# Patient Record
Sex: Male | Born: 1980 | Race: Black or African American | Hispanic: No | Marital: Single | Smoking: Current every day smoker
Health system: Southern US, Community
[De-identification: ages and names within clinical notes are randomized; demographics above are authoritative.]

## PROBLEM LIST (undated history)

## (undated) DIAGNOSIS — I38 Endocarditis, valve unspecified: Secondary | ICD-10-CM

## (undated) DIAGNOSIS — I10 Essential (primary) hypertension: Secondary | ICD-10-CM

## (undated) DIAGNOSIS — I2510 Atherosclerotic heart disease of native coronary artery without angina pectoris: Secondary | ICD-10-CM

## (undated) HISTORY — PX: CARDIAC CATHETERIZATION: SHX172

---

## 2011-08-28 ENCOUNTER — Emergency Department (HOSPITAL_BASED_OUTPATIENT_CLINIC_OR_DEPARTMENT_OTHER)
Admission: EM | Admit: 2011-08-28 | Discharge: 2011-08-28 | Disposition: A | Discharge status: Home / Self Care | Payer: Self-pay | Attending: Emergency Medicine | Admitting: Emergency Medicine

## 2011-08-28 ENCOUNTER — Encounter (HOSPITAL_BASED_OUTPATIENT_CLINIC_OR_DEPARTMENT_OTHER): Payer: Self-pay

## 2011-08-28 DIAGNOSIS — H109 Unspecified conjunctivitis: Secondary | ICD-10-CM | POA: Insufficient documentation

## 2011-08-28 DIAGNOSIS — F17200 Nicotine dependence, unspecified, uncomplicated: Secondary | ICD-10-CM | POA: Insufficient documentation

## 2011-08-28 DIAGNOSIS — I10 Essential (primary) hypertension: Secondary | ICD-10-CM | POA: Insufficient documentation

## 2011-08-28 HISTORY — DX: Essential (primary) hypertension: I10

## 2011-08-28 MED ORDER — CIPROFLOXACIN HCL 0.3 % OP SOLN
2.0000 [drp] | Freq: Once | OPHTHALMIC | Status: AC
  Administered 2011-08-28: 2 [drp] via OPHTHALMIC
  Filled 2011-08-28: qty 2.5

## 2011-08-28 MED ORDER — TETRACAINE HCL 0.5 % OP SOLN
2.0000 [drp] | Freq: Once | OPHTHALMIC | Status: DC
  Filled 2011-08-28: qty 2

## 2011-08-28 MED ORDER — FLUORESCEIN SODIUM 1 MG OP STRP
1.0000 | ORAL_STRIP | Freq: Once | OPHTHALMIC | Status: DC
  Filled 2011-08-28: qty 1

## 2011-08-28 NOTE — ED Provider Notes (Signed)
Medical screening examination/treatment/procedure(s) were performed by non-physician practitioner and as supervising physician I was immediately available for consultation/collaboration.   Forbes Loll, MD 08/28/11 1947 

## 2011-08-28 NOTE — ED Provider Notes (Signed)
History     CSN: [id number]  Arrival date & time 08/28/11  1635   First MD Initiated Contact with Patient 08/28/11 1648      Chief Complaint  Patient presents with  . Eye Problem    (Consider location/radiation/quality/duration/timing/severity/associated sxs/prior treatment) HPI Comments: Patient presents with a two-day history of eye redness. Patient states that symptoms started gradually yesterday. It causes eyes to itch. Patient states that the redness is worse in the left side however he does have some symptoms on the right. No severe blurry vision. Patient does not have pain with movement of the eyes. Upon waking this morning the patient noted to have crusting on his eyelashes. No known sick contacts. No treatments prior to arrival. Onset was gradual. Course is constant. Nothing makes symptoms better or worse.  Patient is a 31 y.o. male presenting with conjunctivitis. The history is provided by the patient.  Conjunctivitis  The current episode started yesterday. The onset was gradual. The problem has been unchanged. The problem is mild. Nothing relieves the symptoms. Nothing aggravates the symptoms. Associated symptoms include eye itching, headaches, eye discharge and eye redness. Pertinent negatives include no fever, no decreased vision, no double vision, no photophobia, no nausea, no vomiting, no congestion, no ear discharge, no ear pain, no rhinorrhea, no sore throat, no rash and no eye pain. There is pain in both eyes. The eye pain is not associated with movement. The eyelid exhibits no abnormality.    Past Medical History  Diagnosis Date  . Hypertension     History reviewed. No pertinent past surgical history.  No family history on file.  History  Substance Use Topics  . Smoking status: Current Everyday Smoker -- 0.5 packs/day  . Smokeless tobacco: Never Used  . Alcohol Use: Yes     occasional      Review of Systems  Constitutional: Positive for fatigue. Negative  for fever.  HENT: Negative for ear pain, congestion, sore throat, rhinorrhea, neck stiffness, sinus pressure and ear discharge.   Eyes: Positive for discharge, redness and itching. Negative for double vision, photophobia and pain.  Gastrointestinal: Negative for nausea and vomiting.  Skin: Negative for rash.  Neurological: Positive for headaches.  Hematological: Negative for adenopathy.    Allergies  Review of patient's allergies indicates no known allergies.  Home Medications   Current Outpatient Rx  Name Route Sig Dispense Refill  . DIPHENHYDRAMINE HCL 25 MG PO TABS Oral Take 50 mg by mouth once as needed. For allergies    . LISINOPRIL-HYDROCHLOROTHIAZIDE 20-25 MG PO TABS Oral Take 1 tablet by mouth daily.    Marland Kitchen NAPROXEN SODIUM 220 MG PO TABS Oral Take 440 mg by mouth 2 (two) times daily as needed. For pain      BP 150/93  Pulse 70  Temp 98.4 F (36.9 C) (Oral)  Resp 20  Ht 5\' 11"  (1.803 m)  Wt 180 lb (81.647 kg)  BMI 25.10 kg/m2  SpO2 100%  Physical Exam  Nursing note and vitals reviewed. Constitutional: He appears well-developed and well-nourished.  HENT:  Head: Normocephalic and atraumatic.  Right Ear: External ear normal.  Left Ear: External ear normal.  Nose: Nose normal.  Mouth/Throat: Oropharynx is clear and moist. No oropharyngeal exudate.  Eyes: EOM are normal. Pupils are equal, round, and reactive to light. Right eye exhibits no chemosis, no discharge and no exudate. No foreign body present in the right eye. Left eye exhibits no chemosis, no discharge and no exudate. No foreign body  present in the left eye. Right conjunctiva is injected. Right conjunctiva has no hemorrhage. Left conjunctiva is injected (Left greater than right). Left conjunctiva has no hemorrhage. Right eye exhibits normal extraocular motion. Left eye exhibits normal extraocular motion.  Neck: Normal range of motion. Neck supple.  Pulmonary/Chest: No respiratory distress.  Neurological: He is  alert.  Skin: Skin is warm and dry.  Psychiatric: He has a normal mood and affect.    ED Course  Procedures (including critical care time)  Labs Reviewed - No data to display No results found.   1. Conjunctivitis of both eyes     5:29 PM Patient seen and examined. Medications ordered.   Vital signs reviewed and are as follows: Filed Vitals:   08/28/11 1640  BP: 150/93  Pulse: 70  Temp: 98.4 F (36.9 C)  Resp: 20   Patient counseled on good hygiene. Patient urged to follow up with ophthalmologist if not improved in 3 days. Patient counseled on use of ciprofloxacin eyedrops. Patient urged to return with fever, loss of vision, worsening vision, redness or swelling around his eye, if he has trouble moving his eye, or has any other concerns. Patient verbalizes understanding and agrees with the plan.   MDM  Bilateral conjunctivitis.  No foreign bodies suspected. No surrounding erythema, swelling, vision changes/loss suspicious for orbital or periorbital cellulitis. No signs of iritis. No signs of glaucoma. No symptoms of retinal detachment. No ophthalmologic emergency suspected. Outpatient referral given in case of no improvement.          Renne Crigler, [place] 08/28/11 1734

## 2011-08-28 NOTE — ED Notes (Signed)
Left eye redness and blurry since yesterday.

## 2011-08-31 ENCOUNTER — Emergency Department (HOSPITAL_BASED_OUTPATIENT_CLINIC_OR_DEPARTMENT_OTHER)
Admission: EM | Admit: 2011-08-31 | Discharge: 2011-08-31 | Disposition: A | Discharge status: Home / Self Care | Payer: Self-pay | Attending: Emergency Medicine | Admitting: Emergency Medicine

## 2011-08-31 ENCOUNTER — Encounter (HOSPITAL_BASED_OUTPATIENT_CLINIC_OR_DEPARTMENT_OTHER): Payer: Self-pay | Admitting: *Deleted

## 2011-08-31 DIAGNOSIS — H109 Unspecified conjunctivitis: Secondary | ICD-10-CM | POA: Insufficient documentation

## 2011-08-31 DIAGNOSIS — I10 Essential (primary) hypertension: Secondary | ICD-10-CM | POA: Insufficient documentation

## 2011-08-31 DIAGNOSIS — R300 Dysuria: Secondary | ICD-10-CM | POA: Insufficient documentation

## 2011-08-31 DIAGNOSIS — H538 Other visual disturbances: Secondary | ICD-10-CM

## 2011-08-31 DIAGNOSIS — F17200 Nicotine dependence, unspecified, uncomplicated: Secondary | ICD-10-CM | POA: Insufficient documentation

## 2011-08-31 LAB — URINALYSIS, ROUTINE W REFLEX MICROSCOPIC
Glucose, UA: NEGATIVE mg/dL
Ketones, ur: NEGATIVE mg/dL
Leukocytes, UA: NEGATIVE
Nitrite: NEGATIVE
Protein, ur: NEGATIVE mg/dL

## 2011-08-31 NOTE — ED Provider Notes (Addendum)
History    This chart was scribed for Jesus B. Bernette Mayers, MD, MD by Jesus Silva. The patient was seen in room MH11 and the patient's care was started at 6:08PM.   CSN: [id number]  Arrival date & time 08/31/11  1731   First MD Initiated Contact with Patient 08/31/11 1806      Chief Complaint  Patient presents with  . Conjunctivitis     . Dysuria    (Consider location/radiation/quality/duration/timing/severity/associated sxs/prior treatment) Patient is a 31 y.o. male presenting with conjunctivitis, STD exposure, and dysuria. The history is provided by the patient.  Conjunctivitis   Exposure to STD  Dysuria    Jesus Silva is a 31 y.o. male who presents to the Emergency Department complaining of moderate, conjunctivitis onset 4 days ago and dysuria onset 1 day ago. Pt reports that he was seen in ED 3 days ago for conjunctivitis and given eye drops. He reports that eye irritation symptoms have not improved since using the eye drops and still has complaints of blurry vision which is getting worse. He states that he had a brief self-limited nose bleed just PTA. As far as the dysuria, that started yesterday, burning with urination, but no fever, nausea or flank pain. Pt denies penile discharge, testicular pain and hematuria. Pt reports that he has not had unprotected sex within last 6 months. He states that he had his last STD test (negative) 1 year ago. Symptoms have been constant without radiation.   Past Medical History  Diagnosis Date  . Hypertension     History reviewed. No pertinent past surgical history.  History reviewed. No pertinent family history.  History  Substance Use Topics  . Smoking status: Current Everyday Smoker -- 0.5 packs/day  . Smokeless tobacco: Never Used  . Alcohol Use: Yes     occasional      Review of Systems  Genitourinary: Positive for dysuria.  All other systems reviewed and are negative.  10 Systems reviewed and all are negative for acute  change except as noted in the HPI.    Allergies  Review of patient's allergies indicates no known allergies.  Home Medications   Current Outpatient Rx  Name Route Sig Dispense Refill  . LISINOPRIL-HYDROCHLOROTHIAZIDE 20-25 MG PO TABS Oral Take 1 tablet by mouth daily.    Marland Kitchen DIPHENHYDRAMINE HCL 25 MG PO TABS Oral Take 50 mg by mouth once as needed. For allergies    . NAPROXEN SODIUM 220 MG PO TABS Oral Take 440 mg by mouth 2 (two) times daily as needed. For pain      BP 155/91  Pulse 80  Temp 98.6 F (37 C) (Oral)  Resp 20  Ht 5\' 11"  (1.803 m)  Wt 180 lb (81.647 kg)  BMI 25.10 kg/m2  SpO2 100%  Physical Exam  Nursing note and vitals reviewed. Constitutional: He is oriented to person, place, and time. He appears well-developed and well-nourished.  HENT:  Head: Normocephalic and atraumatic.  Eyes: EOM are normal. Pupils are equal, round, and reactive to light. Right conjunctiva is injected (mild). Left conjunctiva is injected (mild).       Anterior chambers are nl   Neck: Normal range of motion. Neck supple.  Pulmonary/Chest: Effort normal. No respiratory distress.  Neurological: He is alert and oriented to person, place, and time. No cranial nerve deficit.  Skin: Skin is warm and dry.  Psychiatric: He has a normal mood and affect. His behavior is normal.    ED Course  Procedures (including  critical care time) DIAGNOSTIC STUDIES: Oxygen Saturation is 100% on room air , normal by my interpretation.    COORDINATION OF CARE:     Labs Reviewed  URINALYSIS, ROUTINE W REFLEX MICROSCOPIC   No results found.   No diagnosis found.    MDM  Visual acuity worsening since prior ED visit. He was advised to followup with Ophtho (Dr. Vonna Kotyk) at that visit, but did not call that office yet. Dysuria is of unclear etiology, UA is normal. Doubt STD given lack of symptoms and no recent unprotected sex. Will send urine for GC/C but won't treat empirically. Advised to finish taking  the eye drops but to call Dr. Vonna Kotyk' office for a more comprehensive eye exam.      I personally performed the services described in the documentation, which were scribed in my presence. The recorded information has been reviewed and considered.     Jesus B. Bernette Mayers, MD 08/31/11 1829  Jesus Levan. Bernette Mayers, MD 08/31/11 [phone]

## 2011-08-31 NOTE — ED Notes (Signed)
Pt amb to triage with quick steady gait smiling in nad. Pt reports seen here and dx with pink eye, "it's not getting any better with the drops". Also states he is having dysuria.

## 2011-09-01 LAB — GC/CHLAMYDIA PROBE AMP, URINE: GC Probe Amp, Urine: NEGATIVE

## 2012-06-21 ENCOUNTER — Encounter (HOSPITAL_COMMUNITY): Payer: Self-pay | Admitting: Nurse Practitioner

## 2012-06-21 ENCOUNTER — Emergency Department (HOSPITAL_COMMUNITY): Payer: BC Managed Care – PPO

## 2012-06-21 ENCOUNTER — Emergency Department (HOSPITAL_COMMUNITY)
Admission: EM | Admit: 2012-06-21 | Discharge: 2012-06-21 | Disposition: A | Discharge status: Home / Self Care | Payer: BC Managed Care – PPO | Attending: Emergency Medicine | Admitting: Emergency Medicine

## 2012-06-21 DIAGNOSIS — Z79899 Other long term (current) drug therapy: Secondary | ICD-10-CM | POA: Insufficient documentation

## 2012-06-21 DIAGNOSIS — F17200 Nicotine dependence, unspecified, uncomplicated: Secondary | ICD-10-CM | POA: Insufficient documentation

## 2012-06-21 DIAGNOSIS — I10 Essential (primary) hypertension: Secondary | ICD-10-CM | POA: Insufficient documentation

## 2012-06-21 DIAGNOSIS — R079 Chest pain, unspecified: Secondary | ICD-10-CM | POA: Insufficient documentation

## 2012-06-21 DIAGNOSIS — R0781 Pleurodynia: Secondary | ICD-10-CM

## 2012-06-21 MED ORDER — HYDROCODONE-ACETAMINOPHEN 5-325 MG PO TABS: ORAL_TABLET | ORAL | Status: DC

## 2012-06-21 NOTE — ED Provider Notes (Signed)
History     CSN: [id number]  Arrival date & time 06/21/12  1043   First MD Initiated Contact with Patient 06/21/12 1059      Chief Complaint  Patient presents with  . Abdominal Pain    (Consider location/radiation/quality/duration/timing/severity/associated sxs/prior treatment) Patient is a 32 y.o. male presenting with abdominal pain.  Abdominal Pain  Jesus Silva is a 32 y.o. male complaining of left-sided mid axillary lower rib pain onset last night significantly worsened this morning. Patient took [facility] powder and was not helped by this. He denies any trauma, recent lifting, recent strenuous activities. He denies shortness of breath.  Past Medical History  Diagnosis Date  . Hypertension     History reviewed. No pertinent past surgical history.  History reviewed. No pertinent family history.  History  Substance Use Topics  . Smoking status: Current Every Day Smoker -- 0.50 packs/day    Types: Cigarettes  . Smokeless tobacco: Never Used  . Alcohol Use: Yes     Comment: occasional      Review of Systems  Constitutional:       Negative except as described in HPI  HENT:       Negative except as described in HPI  Respiratory:       Negative except as described in HPI  Cardiovascular:       Negative except as described in HPI  Gastrointestinal:       Negative except as described in HPI  Genitourinary:       Negative except as described in HPI  Musculoskeletal:       Negative except as described in HPI  Skin:       Negative except as described in HPI  Neurological:       Negative except as described in HPI  All other systems reviewed and are negative.    Allergies  Review of patient's allergies indicates no known allergies.  Home Medications   Current Outpatient Rx  Name  Route  Sig  Dispense  Refill  . HYDROcodone-acetaminophen (NORCO/VICODIN) 5-325 MG per tablet      Take 1-2 tablets by mouth every 6 hours as needed for pain.   15 tablet   0      BP 163/97  Pulse 80  Temp(Src) 98.8 F (37.1 C) (Oral)  Resp 16  Ht 5\' 11"  (1.803 m)  Wt 180 lb (81.647 kg)  BMI 25.12 kg/m2  SpO2 100%  Physical Exam  Nursing note and vitals reviewed. Constitutional: He is oriented to person, place, and time. He appears well-developed and well-nourished. No distress.  HENT:  Head: Normocephalic and atraumatic.  Mouth/Throat: Oropharynx is clear and moist.  Eyes: Conjunctivae and EOM are normal. Pupils are equal, round, and reactive to light.  Neck: Normal range of motion.  Cardiovascular: Normal rate, regular rhythm and intact distal pulses.   Pulmonary/Chest: Effort normal and breath sounds normal. No stridor. No respiratory distress. He has no wheezes. He has no rales. He exhibits tenderness.    Abdominal: Soft. He exhibits no distension and no mass. There is no tenderness. There is no rebound and no guarding.  Musculoskeletal: Normal range of motion.  Neurological: He is alert and oriented to person, place, and time.  Psychiatric: He has a normal mood and affect.    ED Course  Procedures (including critical care time)  Labs Reviewed - No data to display Dg Ribs Unilateral W/chest Left  06/21/2012   *RADIOLOGY REPORT*  Clinical Data: Pain  LEFT RIBS  AND CHEST - 3+ VIEW  Comparison: Chest radiograph September 10, 2010  Findings:  Frontal chest and bilateral oblique views were obtained. Lungs clear.  Heart size and pulmonary vascularity are normal.  No pneumothorax or effusion.  No rib fracture apparent.  IMPRESSION: No abnormality noted.   Original Report Authenticated By: Bretta Bang, M.D.     1. Rib pain on left side       MDM   Filed Vitals:   06/21/12 1054  BP: 163/97  Pulse: 80  Temp: 98.8 F (37.1 C)  TempSrc: Oral  Resp: 16  Height: 5\' 11"  (1.803 m)  Weight: 180 lb (81.647 kg)  SpO2: 100%     Jesus Silva is a 32 y.o. male point tender in the left midaxillary lower ribs. Plain film showed no  abnormalities.   Pt is hemodynamically stable, appropriate for, and amenable to discharge at this time. Pt verbalized understanding and agrees with care plan. Outpatient follow-up and specific return precautions discussed.    Discharge Medication List as of 06/21/2012 12:25 PM    START taking these medications   Details  HYDROcodone-acetaminophen (NORCO/VICODIN) 5-325 MG per tablet Take 1-2 tablets by mouth every 6 hours as needed for pain., [employer], PA-C 06/21/12 1552

## 2012-06-21 NOTE — ED Notes (Signed)
C/o L sided rib pain since last night, increasing in severity since onset, unrelieved by [facility] powder. Denies any injuries or other complaints

## 2012-06-21 NOTE — ED Notes (Signed)
Pt returned from xray

## 2012-06-21 NOTE — ED Notes (Signed)
Patient transported to X-ray 

## 2012-06-22 NOTE — ED Provider Notes (Signed)
Medical screening examination/treatment/procedure(s) were performed by non-physician practitioner and as supervising physician I was immediately available for consultation/collaboration.   Blane Worthington M Paelyn Smick, DO 06/22/12 0952 

## 2012-07-20 ENCOUNTER — Ambulatory Visit: Payer: BC Managed Care – PPO | Admitting: Internal Medicine

## 2012-07-20 DIAGNOSIS — Z0289 Encounter for other administrative examinations: Secondary | ICD-10-CM

## 2012-07-25 ENCOUNTER — Encounter (HOSPITAL_COMMUNITY): Payer: Self-pay | Admitting: *Deleted

## 2012-07-25 ENCOUNTER — Emergency Department (HOSPITAL_COMMUNITY)
Admission: EM | Admit: 2012-07-25 | Discharge: 2012-07-25 | Disposition: A | Discharge status: Home / Self Care | Payer: BC Managed Care – PPO | Attending: Emergency Medicine | Admitting: Emergency Medicine

## 2012-07-25 DIAGNOSIS — Z8679 Personal history of other diseases of the circulatory system: Secondary | ICD-10-CM | POA: Insufficient documentation

## 2012-07-25 DIAGNOSIS — I2510 Atherosclerotic heart disease of native coronary artery without angina pectoris: Secondary | ICD-10-CM | POA: Insufficient documentation

## 2012-07-25 DIAGNOSIS — R42 Dizziness and giddiness: Secondary | ICD-10-CM | POA: Insufficient documentation

## 2012-07-25 DIAGNOSIS — R51 Headache: Secondary | ICD-10-CM | POA: Insufficient documentation

## 2012-07-25 DIAGNOSIS — F17200 Nicotine dependence, unspecified, uncomplicated: Secondary | ICD-10-CM | POA: Insufficient documentation

## 2012-07-25 DIAGNOSIS — Z760 Encounter for issue of repeat prescription: Secondary | ICD-10-CM

## 2012-07-25 DIAGNOSIS — Z79899 Other long term (current) drug therapy: Secondary | ICD-10-CM | POA: Insufficient documentation

## 2012-07-25 DIAGNOSIS — I10 Essential (primary) hypertension: Secondary | ICD-10-CM | POA: Insufficient documentation

## 2012-07-25 HISTORY — DX: Endocarditis, valve unspecified: I38

## 2012-07-25 HISTORY — DX: Atherosclerotic heart disease of native coronary artery without angina pectoris: I25.10

## 2012-07-25 MED ORDER — ACETAMINOPHEN 325 MG PO TABS
650.0000 mg | ORAL_TABLET | Freq: Once | ORAL | Status: AC
  Administered 2012-07-25: 650 mg via ORAL
  Filled 2012-07-25: qty 2

## 2012-07-25 MED ORDER — LISINOPRIL 10 MG PO TABS: 10.0000 mg | ORAL_TABLET | Freq: Every day | ORAL | Status: DC

## 2012-07-25 NOTE — ED Notes (Addendum)
States bp was higher earlier today when checked at [facility]- 153/129. C/o headache. States should be taking lisinopril but ran out

## 2012-07-25 NOTE — ED Provider Notes (Signed)
History    CSN: [id number] Arrival date & time 07/25/12  2102  First MD Initiated Contact with Patient 07/25/12 2214     Chief Complaint  Patient presents with  . Headache   (Consider location/radiation/quality/duration/timing/severity/associated sxs/prior Treatment) HPI  32 year old male with history of hypertension and history of CAD presents for evaluation of headache. 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  Headache is non radiating, 4/10, has similar headache like this before.  No specific treatment tried.  He did report mild lightheadedness and decided to come to [facility] to check his BP and found it to be elevated in 150s systolic.  Sts he usually takes lisinopril however has been without his medication for over a month due to having no refills and no PCP.  He otherwise denies vision changes, sneeze, cough, difficulty thinking, cp, sob, dizziness, ear pain, n/v/d, numbness or weakness.  Denies light/sound sensitivity.  Headache has improved without treatment and lightheadedness has resolved.  Has been eating/drinking as usual.  No hx of exertional syncope.    Past Medical History  Diagnosis Date  . Hypertension   . Coronary artery disease   . Heart valve disorder    No past surgical history on file. No family history on file. History  Substance Use Topics  . Smoking status: Current Every Day Smoker -- 0.50 packs/day    Types: Cigarettes  . Smokeless tobacco: Never Used  . Alcohol Use: 3.3 oz/week    3 Shots of liquor, 3 Drinks containing 0.5 oz of alcohol per week    Review of Systems  All other systems reviewed and are negative.    Allergies  Review of patient's allergies indicates no known allergies.  Home Medications  No current outpatient prescriptions on file. BP 117/68  Pulse 87  Temp(Src) 98.7 F (37.1 C) (Oral)  Resp 20  SpO2 100% Physical Exam  Nursing note and vitals  reviewed. Constitutional: He appears well-developed and well-nourished. No distress.  Awake, alert, nontoxic appearance  HENT:  Head: Atraumatic.  Right Ear: External ear normal.  Mouth/Throat: Oropharynx is clear and moist.  Eyes: Conjunctivae and EOM are normal. Pupils are equal, round, and reactive to light. Right eye exhibits no discharge. Left eye exhibits no discharge.  Neck: Normal range of motion. Neck supple.  Cardiovascular: Normal rate, regular rhythm and intact distal pulses.  Exam reveals no gallop and no friction rub.   No murmur heard. Pulmonary/Chest: Effort normal. No respiratory distress. He exhibits no tenderness.  Abdominal: Soft. There is no tenderness. There is no rebound.  Musculoskeletal: He exhibits no tenderness.  ROM appears intact, no obvious focal weakness  Neurological: He is alert. He has normal strength. No cranial nerve deficit or sensory deficit. He displays a negative Romberg sign. Coordination and gait normal. GCS eye subscore is 4. GCS verbal subscore is 5. GCS motor subscore is 6.  Skin: Skin is warm and dry. No rash noted.  Psychiatric: He has a normal mood and affect.    ED Course  Procedures (including critical care time)  10:30 PM    Headache similar to previous, no fever, neck stiffness, neuro findings or new symptoms to suggest more serious etiology.  I don't think SAH, ICH, meningitis, encephalitis, mass at this time.  No recent trauma.  I don't feel imaging necessary at this time.  Plan to control symptoms.  Pt's BP is normal at this time, is afebrile,  VSS.  Plan to refill his lisinopril and also give resource for pt to f/u for further care.  Recommend having vision check and avoid prolonged staring at the computer as it can worsen his sxs.  No orthostatic hypotension.     Labs Reviewed - No data to display No results found. 1. Headache   2. Medication refill     MDM  BP 150/94  Pulse 66  Temp(Src) 98.7 F (37.1 C) (Oral)  Resp 20   SpO2 100%   Fayrene Helper, PA-C 07/25/12 2258

## 2012-07-26 NOTE — ED Provider Notes (Signed)
Medical screening examination/treatment/procedure(s) were performed by non-physician practitioner and as supervising physician I was immediately available for consultation/collaboration.   Ashby Dawes, MD 07/26/12 1009

## 2012-08-11 ENCOUNTER — Ambulatory Visit: Payer: BC Managed Care – PPO | Admitting: Family Medicine

## 2013-05-15 ENCOUNTER — Encounter (HOSPITAL_COMMUNITY): Payer: Self-pay | Admitting: Emergency Medicine

## 2013-05-15 ENCOUNTER — Emergency Department (HOSPITAL_COMMUNITY)
Admission: EM | Admit: 2013-05-15 | Discharge: 2013-05-15 | Disposition: A | Discharge status: Home / Self Care | Payer: BC Managed Care – PPO | Attending: Emergency Medicine | Admitting: Emergency Medicine

## 2013-05-15 DIAGNOSIS — Z202 Contact with and (suspected) exposure to infections with a predominantly sexual mode of transmission: Secondary | ICD-10-CM | POA: Insufficient documentation

## 2013-05-15 DIAGNOSIS — Z206 Contact with and (suspected) exposure to human immunodeficiency virus [HIV]: Secondary | ICD-10-CM

## 2013-05-15 DIAGNOSIS — Z79899 Other long term (current) drug therapy: Secondary | ICD-10-CM | POA: Insufficient documentation

## 2013-05-15 DIAGNOSIS — I2510 Atherosclerotic heart disease of native coronary artery without angina pectoris: Secondary | ICD-10-CM | POA: Insufficient documentation

## 2013-05-15 DIAGNOSIS — I10 Essential (primary) hypertension: Secondary | ICD-10-CM | POA: Insufficient documentation

## 2013-05-15 DIAGNOSIS — F17200 Nicotine dependence, unspecified, uncomplicated: Secondary | ICD-10-CM | POA: Insufficient documentation

## 2013-05-15 MED ORDER — RALTEGRAVIR POTASSIUM 400 MG PO TABS: 400.0000 mg | ORAL_TABLET | Freq: Two times a day (BID) | ORAL | Status: AC

## 2013-05-15 MED ORDER — RALTEGRAVIR POTASSIUM 400 MG PO TABS
400.0000 mg | ORAL_TABLET | Freq: Two times a day (BID) | ORAL | Status: DC
  Administered 2013-05-15: 400 mg via ORAL
  Filled 2013-05-15: qty 1

## 2013-05-15 MED ORDER — EMTRICITABINE-TENOFOVIR DF 200-300 MG PO TABS: 1.0000 | ORAL_TABLET | Freq: Every day | ORAL | Status: AC

## 2013-05-15 MED ORDER — EMTRICITABINE-TENOFOVIR DF 200-300 MG PO TABS
1.0000 | ORAL_TABLET | Freq: Every day | ORAL | Status: DC
  Administered 2013-05-15: 1 via ORAL
  Filled 2013-05-15: qty 1

## 2013-05-15 NOTE — Discharge Instructions (Signed)
AIDS AIDS (Acquired Immune Deficiency Syndrome) is a severe viral infection caused by the Human Immunodeficiency Virus (HIV). This virus destroys a person's resistance to disease and certain cancers. It is transmitted through blood, blood products, and body fluids. Although the fear of AIDS has grown faster than the epidemic, it is important to note that AIDS is not spread by casual contact and is easily killed by hot water, soap, bleach, and most antiseptics. HOW THE AIDS INFECTION WORKS Once HIV enters the body it affects T-helper (T-4) lymphocytes. These are white blood cells that are crucial for the immune system. Once they become infected, they become factories for producing the AIDS Virus. Eventually these infected T-4 cells die. This leaves the victim susceptible to infection and certain cancers. While anyone can get AIDS, you are unlikely to get this disease unless you indulge in high-risk behavior. Some of these high-risk behaviors are promiscuous sex, having close relationships with HIV-positive people, and the sharing of needles. This illness was initially more common in homosexual men, but as time progresses, it will most probably affect an equal number of men and women. Babies born to women who are infected, have greater chances of getting AIDS. Infection with HIV may cause a brief, mild illness with fever several weeks after contact. More serious symptoms do not develop until months or years later, so a person can be infected with the virus without showing any symptoms at all. At this stage of the infection there is no way of knowing you are infected unless you have a blood or mouth scraping test for the AIDS virus. SYMPTOMS   Fevers, night sweats, general weakness, enlarged lymph nodes.  Chest pain, pneumonia, chronic cough, shortness of breath.  Weight loss, diarrhea, difficulty swallowing, rectal problems.  Headaches, personality changes, problems with vision and memory.  Skin tumors  (patchy dark areas) and infections. There is no cure or vaccine for AIDS at the present time. Anti-viral antibiotic drugs have been shown to stop the virus from multiplying, which helps prolong health. The best treatment against AIDS is prevention. If you have been infected with HIV, careful medical follow-up with regular blood tests is necessary.  Do not take part in risky behaviors. These include sharing needles and syringes or other sharp instruments like razors with others, having unprotected sex with high-risk people (homosexuals, bisexuals, or prostitutes), and engaging in anal sex (with or without a condom).  For further information about AIDS, please call your caregiver, the health department, or the Center for Disease Control: 800-342-AIDS. MISCONCEPTIONS ABOUT AIDS  You can not get AIDS through casual contact. This includes sitting next to an AIDS infected person, being coughed on, living with, swimming with, eating food prepared by, sitting or lying next to someone with AIDS.  It is not caught from toilet seats, from showers, bath tubs, water fountains, phones, drinking glasses, or food touched or used by people with AIDS. Casual kissing will probably not transmit the disease. "[place] kissing" (putting one's tongue in another's mouth) is probably not a good idea as the AIDS Virus is present in saliva.  You will not get AIDS by donating blood. The needles used by blood banks are sterile and disposable.  There is no evidence that AIDS is transmitted through tears.  AIDS is not caught through mosquitoes.  Children with AIDS will not pass AIDS to other children in school without exchange of blood products or engaging in sex. In school, a child with AIDS is actually at greater risk because  of their weak immune status. Their susceptibility to the viruses and germs (bacteria) carried by children without AIDS is great. TESTING FOR AIDS  An ELISA (enzyme-linked immunoabsorbent assay) is a blood  test available to let you know if you have contracted AIDS. If this test is positive, it is usually repeated.  If positive a second time, a second test known as the Western blot test is performed. If the Western blot test is positive, it means you have been infected with HIV. PREVENTION  The best way to prevent AIDS is to avoid high-risk behavior.  The outlook for defeating AIDS is good. Millions of research dollars are being spent on creating a vaccine to prevent the disease as well as providing a cure. New drugs appear to be extremely effective at controlling the disease.  Your caregiver will educate you in all the most effective and current treatments. Document Released: 12/20/1999 Document Revised: 03/16/2011 Document Reviewed: 12/16/2007 [facility] Patient Information 2014 [place], [place].

## 2013-05-15 NOTE — ED Notes (Addendum)
Pt reports to the ED for eval of exposure to HIV. 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 Pt was exposed on Friday 05/12/13. Pt denies any symptoms at this time. He reports he wants to ask about being placed on an anti retro viral medication for post exposure prophylaxis. Pt A&Ox4, resp e/u, and skin warm and dry.

## 2013-05-15 NOTE — ED Provider Notes (Signed)
CSN: [id number]     Arrival date & time 05/15/13  1709 History   First MD Initiated Contact with Patient 05/15/13 1815     Chief Complaint  Patient presents with  . Exposure to STD     (Consider location/radiation/quality/duration/timing/severity/associated sxs/prior Treatment) HPI Comments: Patient reports having sex with his HIV+ partner on Firday when they suffered a condom break. He presents to the ED today requesting post-exposure prophylaxis.  Patient is a 33 y.o. male presenting with STD exposure. The history is provided by the patient. No language interpreter was used.  Exposure to STD This is a new problem. The current episode started in the past 7 days. The problem has been unchanged. Pertinent negatives include no urinary symptoms. Nothing aggravates the symptoms. He has tried nothing for the symptoms.    Past Medical History  Diagnosis Date  . Hypertension   . Coronary artery disease   . Heart valve disorder    History reviewed. No pertinent past surgical history. History reviewed. No pertinent family history. History  Substance Use Topics  . Smoking status: Current Every Day Smoker -- 0.50 packs/day    Types: Cigarettes  . Smokeless tobacco: Never Used  . Alcohol Use: 3.3 oz/week    3 Shots of liquor, 3 Drinks containing 0.5 oz of alcohol per week     Comment: occasionally    Review of Systems  All other systems reviewed and are negative.     Allergies  Review of patient's allergies indicates no known allergies.  Home Medications   Prior to Admission medications   Medication Sig Start Date End Date Taking? Authorizing Provider  acetaminophen (TYLENOL) 500 MG tablet Take 1,000 mg by mouth daily as needed for mild pain.   Yes Historical Provider, MD  amlodipine-benazepril (LOTREL) 2.5-10 MG per capsule Take by mouth daily. 03/08/13  Yes Historical Provider, MD  Aspirin-Salicylamide-Caffeine (BC HEADACHE POWDER PO) Take 1 packet by mouth daily as needed.    Yes Historical Provider, MD  diphenhydrAMINE (BENADRYL) 25 MG tablet Take 25 mg by mouth daily as needed for allergies.   Yes Historical Provider, MD  hydrOXYzine (ATARAX/VISTARIL) 50 MG tablet Take 50 mg by mouth daily as needed. 03/13/13  Yes Historical Provider, MD   BP 157/105  Pulse 72  Temp(Src) 98.4 F (36.9 C) (Oral)  Resp 18  SpO2 100% Physical Exam  Nursing note and vitals reviewed. Constitutional: He is oriented to person, place, and time. He appears well-developed and well-nourished. No distress.  HENT:  Head: Normocephalic.  Eyes: Conjunctivae are normal. Pupils are equal, round, and reactive to light.  Neck: Normal range of motion.  Cardiovascular: Normal rate and regular rhythm.   Pulmonary/Chest: Effort normal and breath sounds normal.  Abdominal: Soft.  Musculoskeletal: He exhibits no edema and no tenderness.  Lymphadenopathy:    He has no cervical adenopathy.  Neurological: He is alert and oriented to person, place, and time.  Skin: Skin is warm and dry.  Psychiatric: He has a normal mood and affect. His behavior is normal. Judgment and thought content normal.    ED Course  Procedures (including critical care time) Labs Review Labs Reviewed  HIV ANTIBODY (ROUTINE TESTING)  RPR    Imaging Review No results found.   EKG Interpretation None      MDM   Final diagnoses:  None    Exposure to HIV.  Post-exposure prophylaxis intitiated.    Jimmye Normanavid John Jovany Disano, NP 05/16/13 [phone]

## 2013-05-15 NOTE — ED Notes (Signed)
Discharge instructions reviewed with pt. Pt verbalized understanding.   

## 2013-05-15 NOTE — ED Notes (Signed)
Called pharmacy to order medicine.

## 2013-05-16 LAB — T.PALLIDUM AB, IGG

## 2013-05-16 LAB — HIV ANTIBODY (ROUTINE TESTING W REFLEX): HIV: NONREACTIVE

## 2013-05-16 LAB — RPR TITER

## 2013-05-16 LAB — RPR: RPR Ser Ql: REACTIVE — AB

## 2013-05-17 ENCOUNTER — Telehealth (HOSPITAL_BASED_OUTPATIENT_CLINIC_OR_DEPARTMENT_OTHER): Payer: Self-pay | Admitting: Emergency Medicine

## 2013-05-17 NOTE — Telephone Encounter (Signed)
+  RPR. Chart sent to EDP office for review. DHHS attached. 

## 2013-05-18 NOTE — ED Provider Notes (Signed)
  Medical screening examination/treatment/procedure(s) were performed by non-physician practitioner and as supervising physician I was immediately available for consultation/collaboration.   EKG Interpretation None         Taci Sterling, MD 05/18/13 1614 

## 2013-05-19 ENCOUNTER — Telehealth (HOSPITAL_BASED_OUTPATIENT_CLINIC_OR_DEPARTMENT_OTHER): Payer: Self-pay

## 2013-05-19 NOTE — Telephone Encounter (Signed)
Spoke with pt. He called for lab results. Verified ID by SS#.  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  He will follow up with his md.

## 2013-12-25 ENCOUNTER — Encounter (HOSPITAL_COMMUNITY): Payer: Self-pay | Admitting: *Deleted

## 2013-12-25 ENCOUNTER — Emergency Department (HOSPITAL_COMMUNITY): Payer: BC Managed Care – PPO

## 2013-12-25 ENCOUNTER — Emergency Department (HOSPITAL_COMMUNITY)
Admission: EM | Admit: 2013-12-25 | Discharge: 2013-12-25 | Disposition: A | Discharge status: Home / Self Care | Payer: BC Managed Care – PPO | Attending: Emergency Medicine | Admitting: Emergency Medicine

## 2013-12-25 DIAGNOSIS — R5383 Other fatigue: Secondary | ICD-10-CM | POA: Insufficient documentation

## 2013-12-25 DIAGNOSIS — Z79899 Other long term (current) drug therapy: Secondary | ICD-10-CM | POA: Insufficient documentation

## 2013-12-25 DIAGNOSIS — I10 Essential (primary) hypertension: Secondary | ICD-10-CM | POA: Insufficient documentation

## 2013-12-25 DIAGNOSIS — I2510 Atherosclerotic heart disease of native coronary artery without angina pectoris: Secondary | ICD-10-CM | POA: Insufficient documentation

## 2013-12-25 DIAGNOSIS — R5381 Other malaise: Secondary | ICD-10-CM | POA: Diagnosis not present

## 2013-12-25 DIAGNOSIS — Z720 Tobacco use: Secondary | ICD-10-CM | POA: Diagnosis not present

## 2013-12-25 DIAGNOSIS — R0789 Other chest pain: Secondary | ICD-10-CM | POA: Insufficient documentation

## 2013-12-25 DIAGNOSIS — Z9889 Other specified postprocedural states: Secondary | ICD-10-CM | POA: Insufficient documentation

## 2013-12-25 DIAGNOSIS — R079 Chest pain, unspecified: Secondary | ICD-10-CM | POA: Diagnosis present

## 2013-12-25 LAB — BASIC METABOLIC PANEL
Anion gap: 11 (ref 5–15)
BUN: 11 mg/dL (ref 6–23)
CALCIUM: 9.2 mg/dL (ref 8.4–10.5)
CHLORIDE: 102 meq/L (ref 96–112)
CO2: 28 mEq/L (ref 19–32)
Creatinine, Ser: 1.14 mg/dL (ref 0.50–1.35)
GFR calc Af Amer: >90 mL/min (ref >90)
GFR calc non Af Amer: 84 mL/min — ABNORMAL LOW (ref >90)
Glucose, Bld: 90 mg/dL (ref 70–99)
POTASSIUM: 3.9 meq/L (ref 3.7–5.3)
SODIUM: 141 meq/L (ref 137–147)

## 2013-12-25 LAB — CBC
HCT: 45.2 % (ref 39.0–52.0)
Hemoglobin: 15.7 g/dL (ref 13.0–17.0)
MCH: 30.8 pg (ref 26.0–34.0)
MCHC: 34.7 g/dL (ref 30.0–36.0)
MCV: 88.6 fL (ref 78.0–100.0)
Platelets: 210 10*3/uL (ref 150–400)
RBC: 5.1 MIL/uL (ref 4.22–5.81)
RDW: 12.4 % (ref 11.5–15.5)
WBC: 6.4 10*3/uL (ref 4.0–10.5)

## 2013-12-25 LAB — I-STAT TROPONIN, ED: Troponin i, poc: 0 ng/mL (ref 0.00–0.08)

## 2013-12-25 LAB — PRO B NATRIURETIC PEPTIDE: Pro B Natriuretic peptide (BNP): 12.7 pg/mL (ref 0–125)

## 2013-12-25 LAB — TSH: TSH: 1.67 u[IU]/mL (ref 0.350–4.500)

## 2013-12-25 NOTE — ED Provider Notes (Addendum)
CSN: [id number]     Arrival date & time 12/25/13  0106 History  This chart was scribed for Derwood KaplanAnkit Rilei Kravitz, MD by Annye AsaAnna Dorsett, ED Scribe. This patient was seen in room D31C/D31C and the patient's care was started at 1:45 AM.    Chief Complaint  Patient presents with  . Chest Pain   HPI   HPI Comments: Jesus Silva is a 33 y.o. male who presents to the Emergency Department complaining of 1 week of chest "tightness", fatigue and "racing heart" sensations. His discomfort waxes and wanes; it is worse when lying down and improves when sitting up. He denies waking in the night with SOB. He denies any recent weight gain, hair loss. He denies any psych issues or recent life stressors.   Per nurse's notes, patient has been out of his blood pressure medication and NTG for one week at this time.   Patient reports that he was scheduled for a cardiac cath procedure on 6/11 for concerns regarding heart failure; he had an anaphylactic reaction to iodine and the procedure was not performed.  Patient is a .5ppd smoker, occasional EtOH user. He denies substance abuse. Maternal heart transplant at 7130; paternal triple bypass in 940s; mom, dad, and aunt all passed from heart disease.   PCP is Verlon AuBoyd, Tammy Lamonica, MD.  Past Medical History  Diagnosis Date  . Hypertension   . Coronary artery disease   . Heart valve disorder    Past Surgical History  Procedure Laterality Date  . Cardiac catheterization     No family history on file. History  Substance Use Topics  . Smoking status: Current Every Day Smoker -- 0.50 packs/day    Types: Cigarettes  . Smokeless tobacco: Never Used  . Alcohol Use: 3.3 oz/week    3 Shots of liquor, 3 Not specified per week     Comment: occasionally -     Review of Systems  Constitutional: Positive for fatigue.  Respiratory: Positive for chest tightness.   Neurological: Positive for weakness.  All other systems reviewed and are negative.  Allergies  Iodine  Home  Medications   Prior to Admission medications   Medication Sig Start Date End Date Taking? Authorizing Provider  acetaminophen (TYLENOL) 500 MG tablet Take 500 mg by mouth daily as needed for mild pain.    Yes Historical Provider, MD  Aspirin-Salicylamide-Caffeine (BC HEADACHE POWDER PO) Take 1 packet by mouth daily as needed (pain).    Yes Historical Provider, MD  diphenhydrAMINE (BENADRYL) 25 MG tablet Take 25 mg by mouth daily as needed for allergies.   Yes Historical Provider, MD  hydrochlorothiazide (MICROZIDE) 12.5 MG capsule Take 12.5 mg by mouth daily.   Yes Historical Provider, MD  nitroGLYCERIN (NITROSTAT) 0.4 MG SL tablet Place 0.4 mg under the tongue every 5 (five) minutes as needed for chest pain.   Yes Historical Provider, MD  emtricitabine-tenofovir (TRUVADA) 200-300 MG per tablet Take 1 tablet by mouth daily. Patient not taking: Reported on 12/25/2013 05/15/13   Jimmye Normanavid John Smith, NP  hydrOXYzine (ATARAX/VISTARIL) 50 MG tablet Take 50 mg by mouth daily as needed for itching.  03/13/13   Historical Provider, MD  raltegravir (ISENTRESS) 400 MG tablet Take 1 tablet (400 mg total) by mouth 2 (two) times daily. Patient not taking: Reported on 12/25/2013 05/15/13   Jimmye Normanavid John Smith, NP   BP 145/105 mmHg  Pulse 66  Temp(Src) 98.7 F (37.1 C) (Oral)  Resp 17  Ht 5\' 11"  (1.803 m)  Wt 170  lb (77.111 kg)  BMI 23.72 kg/m2  SpO2 98% Physical Exam  Constitutional: He is oriented to person, place, and time. He appears well-developed and well-nourished.  HENT:  Head: Normocephalic and atraumatic.  Mouth/Throat: Oropharynx is clear and moist. No oropharyngeal exudate.  Eyes: EOM are normal. Pupils are equal, round, and reactive to light.  Cardiovascular: Normal rate, regular rhythm and normal heart sounds.  Exam reveals no gallop and no friction rub.   No murmur heard. Pulmonary/Chest: Effort normal and breath sounds normal. No respiratory distress. He has no wheezes. He has no rales.   Positive reproducable chest tightness/discomfort  Abdominal: Soft. There is no tenderness. There is no rebound and no guarding.  Musculoskeletal: Normal range of motion. He exhibits no edema.  BLE exam shows no edema; no unilateral swelling  Neurological: He is alert and oriented to person, place, and time.  Skin: Skin is warm and dry. No rash noted.  Psychiatric: He has a normal mood and affect. His behavior is normal.  Nursing note and vitals reviewed.   ED Course  Procedures   DIAGNOSTIC STUDIES: Oxygen Saturation is 100% on RA, normal by my interpretation.    COORDINATION OF CARE: 1:55 AM Discussed treatment plan with pt at bedside and pt agreed to plan.  3:29 AM Updated patient regarding lab results. Patient verbalized understanding and agreed.    Labs Review Labs Reviewed  BASIC METABOLIC PANEL - Abnormal; Notable for the following:    GFR calc non Af Amer 84 (*)    All other components within normal limits  CBC  PRO B NATRIURETIC PEPTIDE  TSH  I-STAT TROPOININ, ED    Imaging Review No results found.   EKG Interpretation   Date/Time:  Monday December 25 2013 01:13:35 EST Ventricular Rate:  78 PR Interval:  145 QRS Duration: 84 QT Interval:  365 QTC Calculation: 416 R Axis:   41 Text Interpretation:  Sinus rhythm ST elev, probable normal early repol  pattern No acute changes seen Confirmed by Rhunette CroftNANAVATI, MD, Janey GentaANKIT [phone]) on  12/25/2013 1:30:31 AM      MDM   Final diagnoses:  Malaise and fatigue    Medical screening examination/treatment/procedure(s) were performed by me as the supervising physician. Scribe service was utilized for documentation only.  PT comes in with malaise like symptoms, palpitations. Also out of his BP meds. Pt has family hx of heart dz, including need for transplant. [facility] cardi[facility]. Currently, pt is chest pain free. He has exertional dyspnea - however, BNP is normal, and CXR is  clear. Lung exam is also normal. Pt has no signs of DVT, and is PERC neg. We have advised pt to see his Cards for routine f.u and pcp as well. [facility], since he has moved here, and those resources will be provided as well.     Derwood KaplanAnkit Makayela Secrest, MD 01/15/14 1544  Derwood KaplanAnkit Meeyah Ovitt, MD 01/15/14 1606

## 2013-12-25 NOTE — Discharge Instructions (Signed)
We saw you in the ER for WEAKNESS. All the results in the ER are normal, labs and imaging. We are not sure what is causing your symptoms. The workup in the ER is not complete, and is limited to screening for life threatening and emergent conditions only, so please see a primary care doctor for further evaluation.  PLEASE ASK YOUR PRIMARY DOCTOR TO GIVE YOU CARDIOLOGY APPOINTMENT SO THAT YOU STAY IN THE SAME SYSTEM.   Fatigue Fatigue is a feeling of tiredness, lack of energy, lack of motivation, or feeling tired all the time. Having enough rest, good nutrition, and reducing stress will normally reduce fatigue. Consult your caregiver if it persists. The nature of your fatigue will help your caregiver to find out its cause. The treatment is based on the cause.  CAUSES  There are many causes for fatigue. Most of the time, fatigue can be traced to one or more of your habits or routines. Most causes fit into one or more of three general areas. They are: Lifestyle problems  Sleep disturbances.  Overwork.  Physical exertion.  Unhealthy habits.  Poor eating habits or eating disorders.  Alcohol and/or drug use .  Lack of proper nutrition (malnutrition). Psychological problems  Stress and/or anxiety problems.  Depression.  Grief.  Boredom. Medical Problems or Conditions  Anemia.  Pregnancy.  Thyroid gland problems.  Recovery from major surgery.  Continuous pain.  Emphysema or asthma that is not well controlled  Allergic conditions.  Diabetes.  Infections (such as mononucleosis).  Obesity.  Sleep disorders, such as sleep apnea.  Heart failure or other heart-related problems.  Cancer.  Kidney disease.  Liver disease.  Effects of certain medicines such as antihistamines, cough and cold remedies, prescription pain medicines, heart and blood pressure medicines, drugs used for treatment of cancer, and some antidepressants. SYMPTOMS  The symptoms of fatigue  include:   Lack of energy.  Lack of drive (motivation).  Drowsiness.  Feeling of indifference to the surroundings. DIAGNOSIS  The details of how you feel help guide your caregiver in finding out what is causing the fatigue. You will be asked about your present and past health condition. It is important to review all medicines that you take, including prescription and non-prescription items. A thorough exam will be done. You will be questioned about your feelings, habits, and normal lifestyle. Your caregiver may suggest blood tests, urine tests, or other tests to look for common medical causes of fatigue.  TREATMENT  Fatigue is treated by correcting the underlying cause. For example, if you have continuous pain or depression, treating these causes will improve how you feel. Similarly, adjusting the dose of certain medicines will help in reducing fatigue.  HOME CARE INSTRUCTIONS   Try to get the required amount of good sleep every night.  Eat a healthy and nutritious diet, and drink enough water throughout the day.  Practice ways of relaxing (including yoga or meditation).  Exercise regularly.  Make plans to change situations that cause stress. Act on those plans so that stresses decrease over time. Keep your work and personal routine reasonable.  Avoid street drugs and minimize use of alcohol.  Start taking a daily multivitamin after consulting your caregiver. SEEK MEDICAL CARE IF:   You have persistent tiredness, which cannot be accounted for.  You have fever.  You have unintentional weight loss.  You have headaches.  You have disturbed sleep throughout the night.  You are feeling sad.  You have constipation.  You have dry  skin.  You have gained weight.  You are taking any new or different medicines that you suspect are causing fatigue.  You are unable to sleep at night.  You develop any unusual swelling of your legs or other parts of your body. SEEK IMMEDIATE  MEDICAL CARE IF:   You are feeling confused.  Your vision is blurred.  You feel faint or pass out.  You develop severe headache.  You develop severe abdominal, pelvic, or back pain.  You develop chest pain, shortness of breath, or an irregular or fast heartbeat.  You are unable to pass a normal amount of urine.  You develop abnormal bleeding such as bleeding from the rectum or you vomit blood.  You have thoughts about harming yourself or committing suicide.  You are worried that you might harm someone else. MAKE SURE YOU:   Understand these instructions.  Will watch your condition.  Will get help right away if you are not doing well or get worse. Document Released: 10/19/2006 Document Revised: 03/16/2011 Document Reviewed: 04/25/2013 [facility] Patient Information 2015 [place] [place]. This information is not intended to replace advice given to you by your health care provider. Make sure you discuss any questions you have with your health care provider.  Weakness Weakness is a lack of strength. It may be felt all over the body (generalized) or in one specific part of the body (focal). Some causes of weakness can be serious. You may need further medical evaluation, especially if you are elderly or you have a history of immunosuppression (such as chemotherapy or HIV), kidney disease, heart disease, or diabetes. CAUSES  Weakness can be caused by many different things, including:  Infection.  Physical exhaustion.  Internal bleeding or other blood loss that results in a lack of red blood cells (anemia).  Dehydration. This cause is more common in elderly people.  Side effects or electrolyte abnormalities from medicines, such as pain medicines or sedatives.  Emotional distress, anxiety, or depression.  Circulation problems, especially severe peripheral arterial disease.  Heart disease, such as rapid atrial fibrillation, bradycardia, or heart failure.  Nervous system  disorders, such as Guillain-Barr syndrome, multiple sclerosis, or stroke. DIAGNOSIS  To find the cause of your weakness, your caregiver will take your history and perform a physical exam. Lab tests or X-rays may also be ordered, if needed. TREATMENT  Treatment of weakness depends on the cause of your symptoms and can vary greatly. HOME CARE INSTRUCTIONS   Rest as needed.  Eat a well-balanced diet.  Try to get some exercise every day.  Only take over-the-counter or prescription medicines as directed by your caregiver. SEEK MEDICAL CARE IF:   Your weakness seems to be getting worse or spreads to other parts of your body.  You develop new aches or pains. SEEK IMMEDIATE MEDICAL CARE IF:   You cannot perform your normal daily activities, such as getting dressed and feeding yourself.  You cannot walk up and down stairs, or you feel exhausted when you do so.  You have shortness of breath or chest pain.  You have difficulty moving parts of your body.  You have weakness in only one area of the body or on only one side of the body.  You have a fever.  You have trouble speaking or swallowing.  You cannot control your bladder or bowel movements.  You have black or bloody vomit or stools. MAKE SURE YOU:  Understand these instructions.  Will watch your condition.  Will get help right  away if you are not doing well or get worse. Document Released: 12/22/2004 Document Revised: 06/23/2011 Document Reviewed: 02/20/2011 [facility] Patient Information 2015 [place], [place]. This information is not intended to replace advice given to you by your health care provider. Make sure you discuss any questions you have with your health care provider.

## 2013-12-25 NOTE — ED Notes (Signed)
Pt arrives to ED c/o CP for over one week. Pt states that [facility] Dr Delora Fuelhahaun because of heart failure. States he has also been experiencing SOB and a racing heart off and on for over a week as well.

## 2013-12-25 NOTE — ED Notes (Signed)
MD at bedside. 

## 2013-12-25 NOTE — ED Notes (Signed)
Pt states that he is out of his BP medication and NTG. Pt states that he has an appointment with his PCP on Tuesday. States that he has missed a week of BP meds.

## 2014-01-03 ENCOUNTER — Telehealth: Payer: Self-pay | Admitting: *Deleted

## 2014-01-03 NOTE — Telephone Encounter (Signed)
called and ask pt to come in and sign a med release form so that we could request records from his previous cardiologist, he said he would come in Monday 01/08/14.Marland Kitchen..Marland Kitchen

## 2014-01-25 ENCOUNTER — Telehealth: Payer: Self-pay

## 2014-01-25 NOTE — Telephone Encounter (Signed)
SPOKE WITH  DR[place]..[phone] ...  PT WAS MADE AWARE THAT A MEDICAL RELEASE MUST BE SIGNED IN ORDER TO OBTAIN RECORD FOR HIS APPT WITH NISHAN .> DR CHAHAUN  OFFICE  WILL TRY TO CALL PATIENT TO SEE IF HE WILL BE ABLE TO COME TO THE OFFICE TO RETRIEVE HIS RECORDS..Marland Kitchen

## 2014-01-25 NOTE — Telephone Encounter (Signed)
CALLED PATIENT TO  FIND OUT IF HE  WAS ABLE TO GET RECORDS FOR APPT ON MON.JAN.25,2016 WITH NISHAN :. NO ANSWER.

## 2014-01-29 ENCOUNTER — Ambulatory Visit: Payer: BC Managed Care – PPO | Admitting: Cardiovascular Disease

## 2014-02-22 ENCOUNTER — Ambulatory Visit: Payer: Self-pay | Admitting: Cardiology

## 2014-04-03 ENCOUNTER — Ambulatory Visit: Payer: Self-pay | Admitting: Cardiology

## 2014-04-06 ENCOUNTER — Encounter: Payer: Self-pay | Admitting: Cardiology

## 2014-04-25 ENCOUNTER — Emergency Department (HOSPITAL_COMMUNITY)
Admission: EM | Admit: 2014-04-25 | Discharge: 2014-04-25 | Discharge status: Against Medical Advice | Payer: BLUE CROSS/BLUE SHIELD | Attending: Emergency Medicine | Admitting: Emergency Medicine

## 2014-04-25 ENCOUNTER — Encounter (HOSPITAL_COMMUNITY): Payer: Self-pay | Admitting: Emergency Medicine

## 2014-04-25 DIAGNOSIS — R197 Diarrhea, unspecified: Secondary | ICD-10-CM | POA: Insufficient documentation

## 2014-04-25 DIAGNOSIS — R1110 Vomiting, unspecified: Secondary | ICD-10-CM | POA: Diagnosis present

## 2014-04-25 LAB — COMPREHENSIVE METABOLIC PANEL
ALT: 18 U/L (ref 0–53)
ANION GAP: 8 (ref 5–15)
AST: 24 U/L (ref 0–37)
Albumin: 4.2 g/dL (ref 3.5–5.2)
Alkaline Phosphatase: 65 U/L (ref 39–117)
BUN: 6 mg/dL (ref 6–23)
CALCIUM: 8.9 mg/dL (ref 8.4–10.5)
CO2: 27 mmol/L (ref 19–32)
Chloride: 102 mmol/L (ref 96–112)
Creatinine, Ser: 1.04 mg/dL (ref 0.50–1.35)
GFR calc Af Amer: >90 mL/min (ref >90)
GFR calc non Af Amer: >90 mL/min (ref >90)
Glucose, Bld: 107 mg/dL — ABNORMAL HIGH (ref 70–99)
Potassium: 3.7 mmol/L (ref 3.5–5.1)
SODIUM: 137 mmol/L (ref 135–145)
TOTAL PROTEIN: 7.3 g/dL (ref 6.0–8.3)
Total Bilirubin: 0.7 mg/dL (ref 0.3–1.2)

## 2014-04-25 LAB — CBC WITH DIFFERENTIAL/PLATELET
BASOS ABS: 0 10*3/uL (ref 0.0–0.1)
Basophils Relative: 0 % (ref 0–1)
Eosinophils Absolute: 0 10*3/uL (ref 0.0–0.7)
Eosinophils Relative: 0 % (ref 0–5)
HCT: 45.3 % (ref 39.0–52.0)
Hemoglobin: 15.9 g/dL (ref 13.0–17.0)
LYMPHS PCT: 25 % (ref 12–46)
Lymphs Abs: 1.2 10*3/uL (ref 0.7–4.0)
MCH: 31.2 pg (ref 26.0–34.0)
MCHC: 35.1 g/dL (ref 30.0–36.0)
MCV: 88.8 fL (ref 78.0–100.0)
Monocytes Absolute: 0.5 10*3/uL (ref 0.1–1.0)
Monocytes Relative: 10 % (ref 3–12)
NEUTROS ABS: 3.1 10*3/uL (ref 1.7–7.7)
Neutrophils Relative %: 65 % (ref 43–77)
Platelets: 159 10*3/uL (ref 150–400)
RBC: 5.1 MIL/uL (ref 4.22–5.81)
RDW: 12.2 % (ref 11.5–15.5)
WBC: 4.8 10*3/uL (ref 4.0–10.5)

## 2014-04-25 LAB — LIPASE, BLOOD: Lipase: 24 U/L (ref 11–59)

## 2014-04-25 MED ORDER — ONDANSETRON 4 MG PO TBDP
ORAL_TABLET | ORAL | Status: AC
  Filled 2014-04-25: qty 2

## 2014-04-25 MED ORDER — ONDANSETRON 4 MG PO TBDP
8.0000 mg | ORAL_TABLET | Freq: Once | ORAL | Status: AC
  Administered 2014-04-25: 8 mg via ORAL

## 2014-04-25 NOTE — ED Notes (Signed)
Pt c/o nvd x 3 days, unable to keep foot or drink down. C/o abdominal cramping.

## 2014-11-02 ENCOUNTER — Encounter (HOSPITAL_COMMUNITY): Payer: Self-pay | Admitting: Emergency Medicine

## 2014-11-02 ENCOUNTER — Emergency Department (HOSPITAL_COMMUNITY)
Admission: EM | Admit: 2014-11-02 | Discharge: 2014-11-02 | Discharge status: Against Medical Advice | Payer: BLUE CROSS/BLUE SHIELD | Attending: Emergency Medicine | Admitting: Emergency Medicine

## 2014-11-02 ENCOUNTER — Emergency Department (HOSPITAL_COMMUNITY): Payer: BLUE CROSS/BLUE SHIELD

## 2014-11-02 DIAGNOSIS — Z720 Tobacco use: Secondary | ICD-10-CM | POA: Insufficient documentation

## 2014-11-02 DIAGNOSIS — R110 Nausea: Secondary | ICD-10-CM | POA: Diagnosis not present

## 2014-11-02 DIAGNOSIS — R079 Chest pain, unspecified: Secondary | ICD-10-CM | POA: Diagnosis present

## 2014-11-02 DIAGNOSIS — Z9889 Other specified postprocedural states: Secondary | ICD-10-CM | POA: Insufficient documentation

## 2014-11-02 DIAGNOSIS — F1210 Cannabis abuse, uncomplicated: Secondary | ICD-10-CM | POA: Insufficient documentation

## 2014-11-02 DIAGNOSIS — I2510 Atherosclerotic heart disease of native coronary artery without angina pectoris: Secondary | ICD-10-CM | POA: Insufficient documentation

## 2014-11-02 DIAGNOSIS — R509 Fever, unspecified: Secondary | ICD-10-CM | POA: Diagnosis not present

## 2014-11-02 DIAGNOSIS — Z79899 Other long term (current) drug therapy: Secondary | ICD-10-CM | POA: Insufficient documentation

## 2014-11-02 DIAGNOSIS — I10 Essential (primary) hypertension: Secondary | ICD-10-CM | POA: Insufficient documentation

## 2014-11-02 DIAGNOSIS — R319 Hematuria, unspecified: Secondary | ICD-10-CM | POA: Insufficient documentation

## 2014-11-02 DIAGNOSIS — R61 Generalized hyperhidrosis: Secondary | ICD-10-CM | POA: Insufficient documentation

## 2014-11-02 LAB — COMPREHENSIVE METABOLIC PANEL
ALK PHOS: 55 U/L (ref 38–126)
ALT: 23 U/L (ref 17–63)
ANION GAP: 9 (ref 5–15)
AST: 38 U/L (ref 15–41)
Albumin: 3.7 g/dL (ref 3.5–5.0)
BILIRUBIN TOTAL: 1.4 mg/dL — AB (ref 0.3–1.2)
BUN: 11 mg/dL (ref 6–20)
CALCIUM: 8.5 mg/dL — AB (ref 8.9–10.3)
CO2: 24 mmol/L (ref 22–32)
CREATININE: 1.07 mg/dL (ref 0.61–1.24)
Chloride: 101 mmol/L (ref 101–111)
Glucose, Bld: 80 mg/dL (ref 65–99)
Potassium: 5.8 mmol/L — ABNORMAL HIGH (ref 3.5–5.1)
SODIUM: 134 mmol/L — AB (ref 135–145)
TOTAL PROTEIN: 6.5 g/dL (ref 6.5–8.1)

## 2014-11-02 LAB — URINALYSIS, ROUTINE W REFLEX MICROSCOPIC
BILIRUBIN URINE: NEGATIVE
Glucose, UA: NEGATIVE mg/dL
Hgb urine dipstick: NEGATIVE
Ketones, ur: NEGATIVE mg/dL
NITRITE: NEGATIVE
Protein, ur: NEGATIVE mg/dL
SPECIFIC GRAVITY, URINE: 1.018 (ref 1.005–1.030)
Urobilinogen, UA: 1 mg/dL (ref 0.0–1.0)
pH: 6.5 (ref 5.0–8.0)

## 2014-11-02 LAB — RAPID URINE DRUG SCREEN, HOSP PERFORMED
AMPHETAMINES: NOT DETECTED
BENZODIAZEPINES: NOT DETECTED
Barbiturates: NOT DETECTED
COCAINE: NOT DETECTED
Opiates: NOT DETECTED
Tetrahydrocannabinol: POSITIVE — AB

## 2014-11-02 LAB — TROPONIN I

## 2014-11-02 LAB — CBC
HEMATOCRIT: 49.3 % (ref 39.0–52.0)
HEMOGLOBIN: 17 g/dL (ref 13.0–17.0)
MCH: 31 pg (ref 26.0–34.0)
MCHC: 34.5 g/dL (ref 30.0–36.0)
MCV: 90 fL (ref 78.0–100.0)
Platelets: 262 10*3/uL (ref 150–400)
RBC: 5.48 MIL/uL (ref 4.22–5.81)
RDW: 12.9 % (ref 11.5–15.5)
WBC: 5.6 10*3/uL (ref 4.0–10.5)

## 2014-11-02 LAB — URINE MICROSCOPIC-ADD ON

## 2014-11-02 LAB — PHOSPHORUS: PHOSPHORUS: 3.1 mg/dL (ref 2.5–4.6)

## 2014-11-02 LAB — MAGNESIUM: MAGNESIUM: 1.9 mg/dL (ref 1.7–2.4)

## 2014-11-02 MED ORDER — ASPIRIN 81 MG PO CHEW
324.0000 mg | CHEWABLE_TABLET | Freq: Once | ORAL | Status: AC
  Administered 2014-11-02: 324 mg via ORAL
  Filled 2014-11-02: qty 4

## 2014-11-02 NOTE — Discharge Instructions (Signed)

## 2014-11-02 NOTE — ED Notes (Signed)
Pt is aware urine is needed, urinal at bedside.  

## 2014-11-02 NOTE — ED Notes (Signed)
Lab notified RN light green top hemolyzed, labs reordered.

## 2014-11-02 NOTE — ED Provider Notes (Signed)
Arrival Date & Time: 11/02/14 & 1308 History  HPI Limitations: none. Chief Complaint  Patient presents with  . Chest Pain  . Hematuria   HPI Jesus Silva is a 34 y.o. male with a chief complaint of Chest Pain and Hematuria   Patient presents for assessment of hematuria and chest pain. This recent episode has been present for 6 months without any resolution per the patient. Chest pain described as a dull ache sensation midsternal states that someone is standing on chest and it is worse at night and worse with exertion and better with rest. No radiation into arms. States he has associated nausea and diaphoresis. Patient states that hematuria was first presents in February with sharp stabbing on left side of flank did not have it assessed and it went away. Return several months ago however only hematuria without flank pain therefore did not obtain assessment at that time either. Yesterday blood and urine returned and patient denies any active flank pain at this time. Continued fevers and chills and no other symptomatic endorsements.  Past Medical History  I reviewed & agree with nursing's documentation on PMHx, PSHx, SHx and FHx. Past Medical History  Diagnosis Date  . Hypertension   . Coronary artery disease   . Heart valve disorder    Past Surgical History  Procedure Laterality Date  . Cardiac catheterization     Social History   Social History  . Marital Status: Single    Spouse Name: N/A  . Number of Children: N/A  . Years of Education: N/A   Social History Main Topics  . Smoking status: Current Every Day Smoker -- 0.50 packs/day    Types: Cigarettes  . Smokeless tobacco: Never Used  . Alcohol Use: 3.3 oz/week    3 Shots of liquor, 3 Standard drinks or equivalent per week     Comment: occasionally -   . Drug Use: 1.00 per week    Special: Marijuana     Comment: not used in the past few weeks  . Sexual Activity: Not Asked   Other Topics Concern  . None   Social History  Narrative   No family history on file.  Review of Systems  Complete ROS obtained and pertinent positive and negatives documented above in HPI. All other ROS negative.  Allergies  Iodine  Home Medications   Prior to Admission medications   Medication Sig Start Date End Date Taking? Authorizing Provider  hydrochlorothiazide (MICROZIDE) 12.5 MG capsule Take 12.5 mg by mouth daily.   Yes Historical Provider, MD  Aspirin-Salicylamide-Caffeine (BC HEADACHE POWDER PO) Take 1 packet by mouth daily as needed (pain).     Historical Provider, MD  emtricitabine-tenofovir (TRUVADA) 200-300 MG per tablet Take 1 tablet by mouth daily. Patient not taking: Reported on 12/25/2013 05/15/13   Felicie Morn, NP  nitroGLYCERIN (NITROSTAT) 0.4 MG SL tablet Place 0.4 mg under the tongue every 5 (five) minutes as needed for chest pain.    Historical Provider, MD  raltegravir (ISENTRESS) 400 MG tablet Take 1 tablet (400 mg total) by mouth 2 (two) times daily. Patient not taking: Reported on 12/25/2013 05/15/13   Felicie Morn, NP   Physical Exam  BP 141/98 mmHg  Pulse 67  Temp(Src) 98.4 F (36.9 C) (Oral)  Resp 20  Ht  (1.803 m)  Wt 174 lb (78.926 kg)  BMI 24.28 kg/m2  SpO2 99% Physical Exam  Constitutional: He is oriented to person, place, and time. He appears well-developed and well-nourished.  Non-toxic  appearance. He does not appear ill. No distress.  HENT:  Head: Normocephalic and atraumatic.  Right Ear: External ear normal.  Left Ear: External ear normal.  Eyes: Pupils are equal, round, and reactive to light. No scleral icterus.  Neck: Normal range of motion. Neck supple. No tracheal deviation present.  Cardiovascular: Normal heart sounds and intact distal pulses.   No murmur heard. Pulmonary/Chest: Effort normal and breath sounds normal. No stridor. No respiratory distress. He has no wheezes. He has no rales.  Abdominal: Soft. Bowel sounds are normal. He exhibits no distension. There is no  tenderness. There is no rebound and no guarding.  Musculoskeletal: Normal range of motion.  Neurological: He is alert and oriented to person, place, and time. He has normal strength and normal reflexes. No cranial nerve deficit or sensory deficit.  Skin: Skin is warm and dry. No pallor.  Psychiatric: He has a normal mood and affect. His behavior is normal.  Nursing note and vitals reviewed.     ED Course  Procedures Labs Review Labs Reviewed  URINALYSIS, ROUTINE W REFLEX MICROSCOPIC (NOT AT [facility]) - Abnormal; Notable for the following:    Leukocytes, UA TRACE (*)    All other components within normal limits  URINE RAPID DRUG SCREEN, HOSP PERFORMED - Abnormal; Notable for the following:    Tetrahydrocannabinol POSITIVE (*)    All other components within normal limits  COMPREHENSIVE METABOLIC PANEL - Abnormal; Notable for the following:    Sodium 134 (*)    Potassium 5.8 (*)    Calcium 8.5 (*)    Total Bilirubin 1.4 (*)    All other components within normal limits  CBC  MAGNESIUM  PHOSPHORUS  TROPONIN I  URINE MICROSCOPIC-ADD ON    Imaging Review Dg Chest 2 View  11/02/2014  CLINICAL DATA:  Chest pain for months. Chest tightness. History of asthma. EXAM: CHEST  2 VIEW COMPARISON:  12/25/2013 FINDINGS: The heart size and mediastinal contours are within normal limits. Both lungs are clear. The visualized skeletal structures are unremarkable. IMPRESSION: No active cardiopulmonary disease. Electronically Signed   By: Elige Ko   On: 11/02/2014 14:07    Laboratory and Imaging results were personally reviewed by myself and used in the medical decision making of this patient's treatment and disposition.  EKG Interpretation  EKG Interpretation  Date/Time:  Friday November 02 2014 13:22:22 EDT Ventricular Rate:  73 PR Interval:  149 QRS Duration: 79 QT Interval:  379 QTC Calculation: 418 R Axis:   30 Text Interpretation:  Sinus rhythm Atrial premature complex ST elev, probable  normal early repol pattern Nonspecific TW abnormality, inversion aVL similar to prior No significant change since last tracing Confirmed by [facility] MD, ERIN ([id number]) on 11/02/2014 4:32:14 PM      MDM  Jesus Silva is a 34 y.o. male with H&P as above. ED clinical course as follows:  Patient HDS with ECG that shows Sinus Rhythm with early repolarization and PACs and Nonspecific t wave abnormalities along with T wave inversions in aVL that is presenton prior ECGs. DDX includes consideration for PTX, ACS, Dissection, PE, and Nephrolithiasis or UTI. CXR shows no acute cardiopulmonary disease. CBC and troponin normal.  UA without evidence of UTI or RBCs therefore stone unlikely.  CMP shows hemolysis, therefore requested repeat however patient states he must leave to pick up patient's daughter.   Jesus Silva has made the decision to leave the emergency department against the advice of myself and Dr. Dalene Seltzer.  He has  been informed and understands the inherent risks, including death, disability or CHF.  He has decided to accept the responsibility for this decision. Jesus Silva has been advised that he may return for further evaluation or treatment. His condition at time of discharge was Good.  Jesus Silva had current vital signs as follows:  Blood pressure 141/98, pulse 67, temperature 98.4 F (36.9 C), temperature source Oral, resp. rate 20, height 5\' 11"  (1.803 m), weight 174 lb (78.926 kg), SpO2 99 %.   Jesus Silva has signed the Leaving Against Medical Advice form prior to leaving the department.  Clinical Impression:  1. Chest pain, unspecified chest pain type   2. Hematuria    Disposition:  AMA  Patient care discussed with Dr. Dalene SeltzerSchlossman, who oversaw their evaluation & treatment & voiced agreement. House Officer: Jonette EvaBrad Renad Jenniges, MD, Emergency Medicine.  Jonette EvaBrad Calena Salem, MD 11/04/14 [phone]  Alvira MondayErin Schlossman, MD 11/05/14 [id number]

## 2014-11-02 NOTE — ED Notes (Addendum)
Pt was sent my urgent care due to CP which has been going on intermittently x 6 months. Pt has also reports hematuria. Pt alert x4. NAD at this time. Pt has extensive family history of early MIs.

## 2015-05-10 IMAGING — CR DG CHEST 1V PORT
1 series · 1 of 1 positions shown · non-contrast
Comparison: Chest radiograph performed 06/21/2012

CLINICAL DATA: Acute onset of generalized chest pain for the past
week. Intermittent shortness of breath and sensation of racing
heart. Initial encounter.

EXAM:
PORTABLE CHEST - 1 VIEW

[AP]
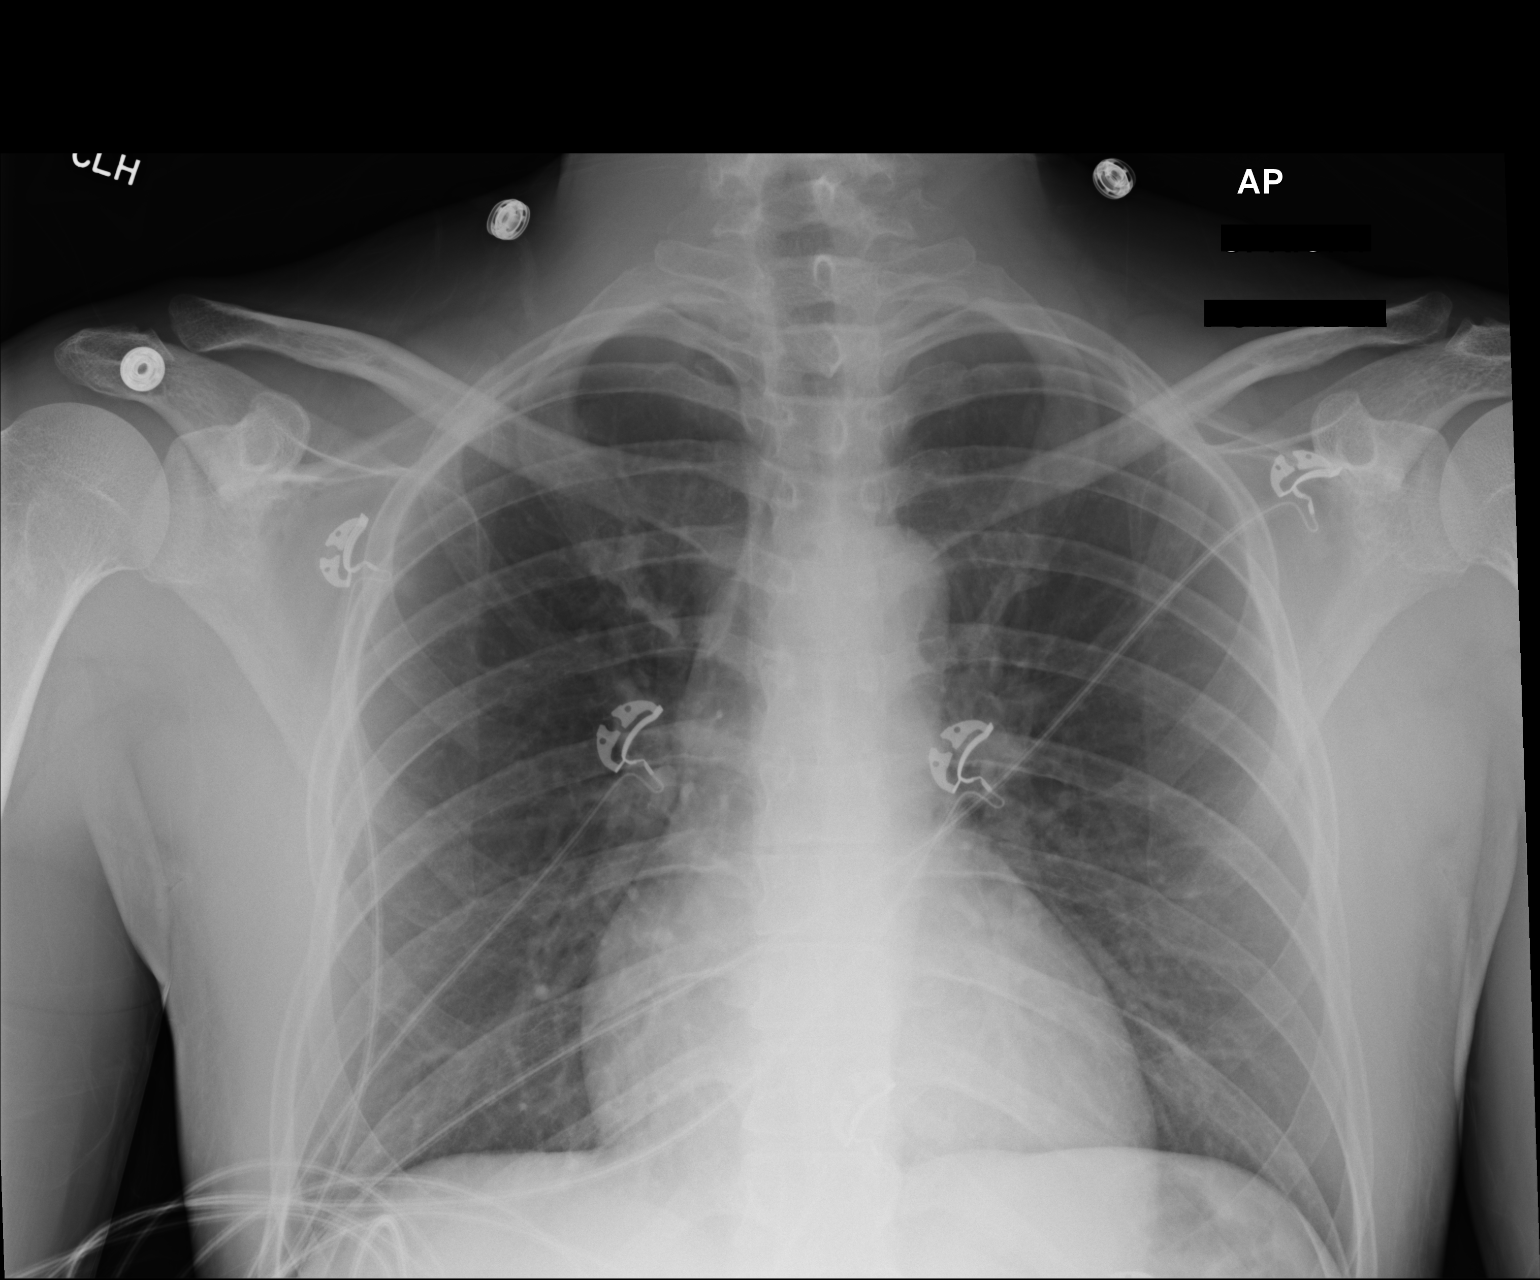

[1 of 1 positions shown; findings below may reference images not displayed]

FINDINGS: The lungs are well-aerated and clear. There is no evidence of focal
opacification, pleural effusion or pneumothorax.

The cardiomediastinal silhouette is within normal limits. No acute
osseous abnormalities are seen.
IMPRESSION: No acute cardiopulmonary process seen. No displaced rib fractures
identified.

## 2016-03-17 IMAGING — CR DG CHEST 2V
2 series · 2 of 2 positions shown · non-contrast
Comparison: 12/25/2013

CLINICAL DATA: Chest pain for months. Chest tightness. History of
asthma.

EXAM:
CHEST  2 VIEW

[chest pa]
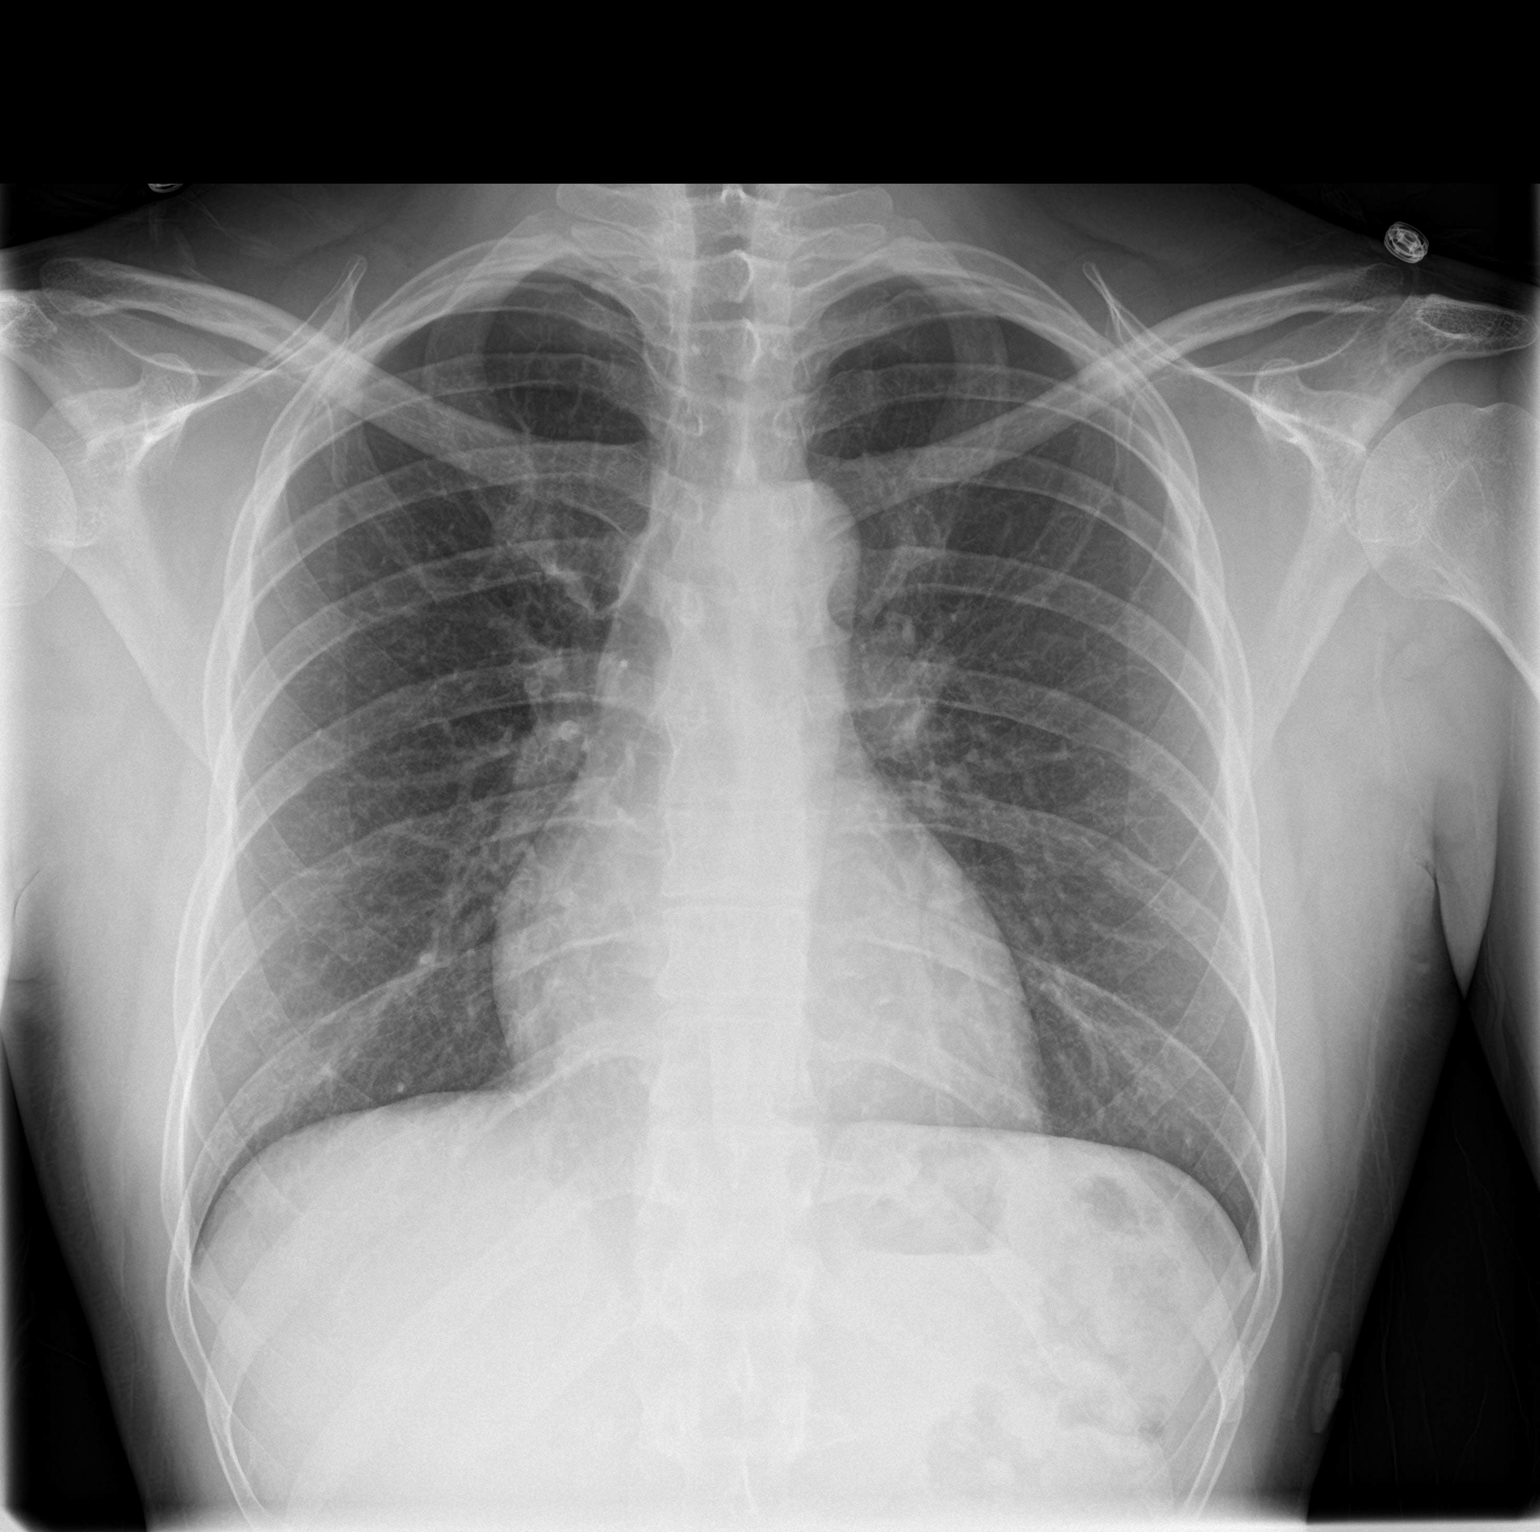

[chest lat]
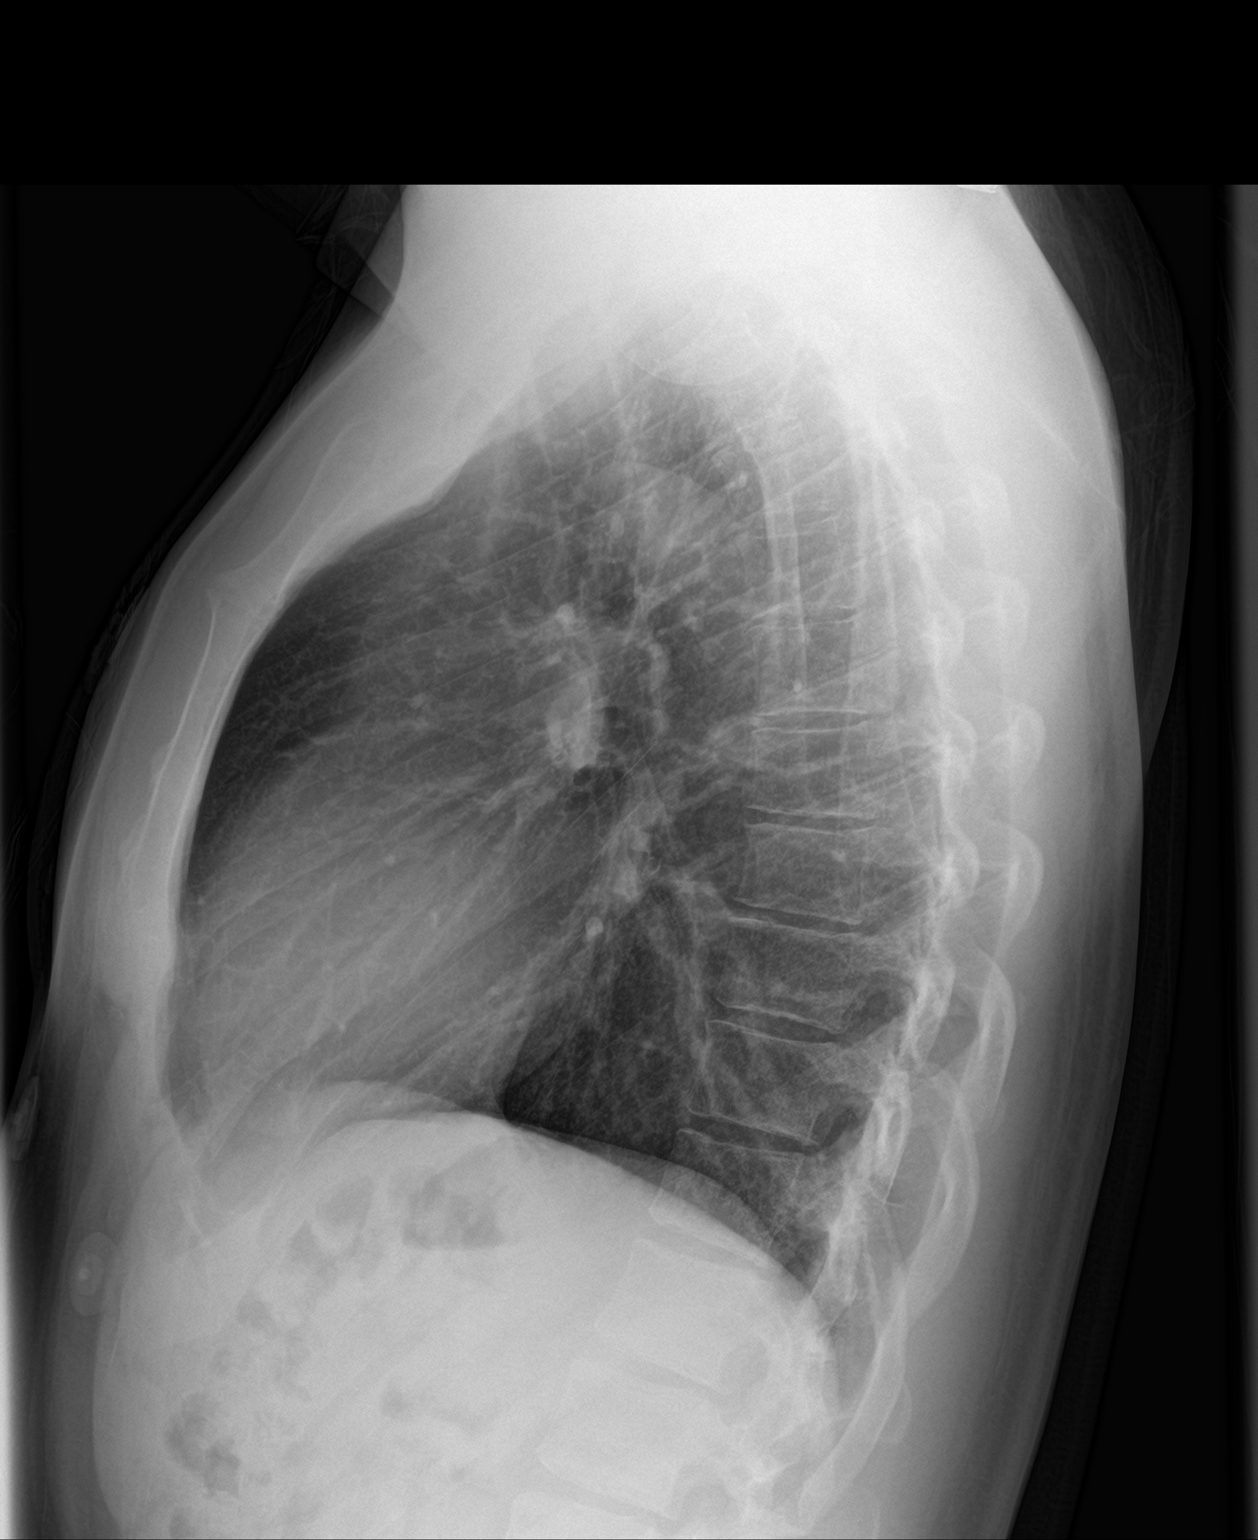

[2 of 2 positions shown; findings below may reference images not displayed]

FINDINGS: The heart size and mediastinal contours are within normal limits.
Both lungs are clear. The visualized skeletal structures are
unremarkable.
IMPRESSION: No active cardiopulmonary disease.
